# Patient Record
Sex: Male | Born: 1995 | Race: Black or African American | Hispanic: No | Marital: Single | Smoking: Never smoker
Health system: Southern US, Community
[De-identification: ages and names within clinical notes are randomized; demographics above are authoritative.]

## PROBLEM LIST (undated history)

## (undated) DIAGNOSIS — J45909 Unspecified asthma, uncomplicated: Secondary | ICD-10-CM

## (undated) HISTORY — DX: Unspecified asthma, uncomplicated: J45.909

## (undated) HISTORY — PX: EYE SURGERY: SHX253

---
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
## 2016-09-19 ENCOUNTER — Ambulatory Visit (HOSPITAL_COMMUNITY)
Admission: EM | Admit: 2016-09-19 | Discharge: 2016-09-19 | Disposition: A | Discharge status: Home / Self Care | Payer: 59 | Attending: Internal Medicine | Admitting: Internal Medicine

## 2016-09-19 ENCOUNTER — Encounter (HOSPITAL_COMMUNITY): Payer: Self-pay | Admitting: Emergency Medicine

## 2016-09-19 DIAGNOSIS — B2790 Infectious mononucleosis, unspecified without complication: Secondary | ICD-10-CM | POA: Diagnosis not present

## 2016-09-19 DIAGNOSIS — J029 Acute pharyngitis, unspecified: Secondary | ICD-10-CM | POA: Diagnosis present

## 2016-09-19 LAB — POCT RAPID STREP A: Streptococcus, Group A Screen (Direct): NEGATIVE

## 2016-09-19 LAB — POCT INFECTIOUS MONO SCREEN: MONO SCREEN: POSITIVE — AB

## 2016-09-19 MED ORDER — PREDNISONE 50 MG PO TABS: 50.0000 mg | ORAL_TABLET | Freq: Every day | ORAL | 0 refills | Status: DC

## 2016-09-19 NOTE — Discharge Instructions (Addendum)
Push fluids, rest.  Avoid contact injury (abdominal hit) for the next several weeks; spleen is often involved in mono and thought to be susceptible to injury.  Prescription for prednisone to help with throat pain/swelling.  Recheck as needed.  May take 4-6 weeks to feel more like yourself (energy level, well being).  Rest when you need to.

## 2016-09-19 NOTE — ED Provider Notes (Signed)
[facility]    CSN: [id number] Arrival date & time: 09/19/16  1128     History   Chief Complaint Chief Complaint  Patient presents with  . Sore Throat    HPI Mark Mueller is a 21 y.o. male. Patient presents today with one-week history of severe sore throat, fever, and malaise/fatigue. Not really coughing, no sinus congestion/drainage. Had a negative strep swab a week ago at the [facility] urgent care. Not really improving.    HPI  Past Medical History:  Diagnosis Date  . Asthma    Childhood    Patient Active Problem List   Diagnosis Date Noted  . Healthy adolescent on routine physical examination 02/20/2014  . Childhood asthma 02/20/2014    Past Surgical History:  Procedure Laterality Date  . EYE SURGERY         Home Medications    Prior to Admission medications   Medication Sig Start Date End Date Taking? Authorizing Provider  brompheniramine-pseudoephedrine-dextromethorphan (DIMETAPP DM) 15-1-5 MG/5ML ELIX Take by mouth every 6 (six) hours as needed.   Yes Historical Provider, MD  naproxen sodium (ANAPROX) 220 MG tablet Take 220 mg by mouth 2 (two) times daily with a meal.   Yes Historical Provider, MD  predniSONE (DELTASONE) 50 MG tablet Take 1 tablet (50 mg total) by mouth daily. 09/19/16   Eustace Moore, MD    Family History Family History  Problem Relation Age of Onset  . Thyroid disease Mother   . Rheum arthritis Father   . Hypertension Father   . Stroke Maternal Grandmother   . Diabetes Maternal Grandmother   . Heart disease Maternal Grandmother   . Hyperlipidemia Maternal Grandmother   . Hypertension Maternal Grandmother   . Hyperlipidemia Maternal Grandfather   . Hypertension Maternal Grandfather   . Hypertension Paternal Grandmother   . Stroke Paternal Grandmother   . Hypertension Paternal Grandfather     Social History Social History  Substance Use Topics  . Smoking status: Never Smoker  . Smokeless tobacco:  Not on file  . Alcohol use Not on file     Allergies   Patient has no known allergies.   Review of Systems Review of Systems  All other systems reviewed and are negative.    Physical Exam Triage Vital Signs ED Triage Vitals  Enc Vitals Group     BP 09/19/16 1211 132/77     Pulse Rate 09/19/16 1211 107     Resp 09/19/16 1211 16     Temp 09/19/16 1211 99.5 F (37.5 C)     Temp Source 09/19/16 1211 Oral     SpO2 09/19/16 1211 100 %     Weight --      Height --      Pain Score 09/19/16 1215 8   Updated Vital Signs BP 132/77 (BP Location: Left Arm)   Pulse 107   Temp 99.5 F (37.5 C) (Oral)   Resp 16   SpO2 100%  Physical Exam  Constitutional: He is oriented to person, place, and time. No distress.  Alert, nicely groomed  HENT:  Head: Atraumatic.  Moderately dull TMs bilaterally pink flushed but no erythema Moderate nasal congestion Very large red tonsils with necrotic-appearing exudates Voice quality is muffled  Eyes:  Conjugate gaze, no eye redness/drainage  Neck: Neck supple.  Cardiovascular: Normal rate.   Pulmonary/Chest: No respiratory distress.  Lungs clear, symmetric breath sounds  Abdominal: He exhibits no distension.  Musculoskeletal: Normal range of motion.  Neurological: He is alert and oriented to person, place, and time.  Skin: Skin is warm and dry.  No cyanosis  Nursing note and vitals reviewed.    UC Treatments / Results  Labs Results for orders placed or performed during the hospital encounter of 09/19/16  Culture, group A strep  Result Value Ref Range   Specimen Description THROAT    Special Requests NONE    Culture CULTURE REINCUBATED FOR BETTER GROWTH    Report Status PENDING   POCT rapid strep A [facility]  Result Value Ref Range   Streptococcus, Group A Screen (Direct) NEGATIVE NEGATIVE  Infectious mono screen, POC  Result Value Ref Range   Mono Screen POSITIVE (A) NEGATIVE     Procedures Procedures (including  critical care time) None today  Final Clinical Impressions(s) / UC Diagnoses   Final diagnoses:  Mononucleosis   Push fluids, rest.  Avoid contact injury (abdominal hit) for the next several weeks; spleen is often involved in mono and thought to be susceptible to injury.  Prescription for prednisone to help with throat pain/swelling.  Recheck as needed.  May take 4-6 weeks to feel more like yourself (energy level, well being).  Rest when you need to.  New Prescriptions Discharge Medication List as of 09/19/2016  1:05 PM    START taking these medications   Details  predniSONE (DELTASONE) 50 MG tablet Take 1 tablet (50 mg total) by mouth daily., Starting Mon 09/19/2016, Normal         Eustace MooreLaura W Sravya Grissom, MD 09/20/16 2042

## 2016-09-19 NOTE — ED Triage Notes (Signed)
[facility] a [facility] of a sore throat x 1 week. The patient stated that [facility] and prescribed a cough syrup but he stated that he has not gotten any better.

## 2016-09-21 LAB — CULTURE, GROUP A STREP (THRC)

## 2018-06-26 ENCOUNTER — Encounter (HOSPITAL_COMMUNITY): Payer: Self-pay | Admitting: Emergency Medicine

## 2018-06-26 ENCOUNTER — Ambulatory Visit (HOSPITAL_COMMUNITY)
Admission: EM | Admit: 2018-06-26 | Discharge: 2018-06-26 | Disposition: A | Discharge status: Home / Self Care | Payer: 59 | Attending: Family Medicine | Admitting: Family Medicine

## 2018-06-26 ENCOUNTER — Other Ambulatory Visit: Payer: Self-pay

## 2018-06-26 DIAGNOSIS — W06XXXA Fall from bed, initial encounter: Secondary | ICD-10-CM

## 2018-06-26 DIAGNOSIS — S01312A Laceration without foreign body of left ear, initial encounter: Secondary | ICD-10-CM | POA: Diagnosis not present

## 2018-06-26 DIAGNOSIS — S0181XA Laceration without foreign body of other part of head, initial encounter: Secondary | ICD-10-CM

## 2018-06-26 DIAGNOSIS — W268XXA Contact with other sharp object(s), not elsewhere classified, initial encounter: Secondary | ICD-10-CM | POA: Diagnosis not present

## 2018-06-26 NOTE — ED Triage Notes (Signed)
Reports falling out of bed around 5:30 am.  Landed on a lamp, broke lamp.  Laceration to left ear lobe, to area of face in front of left ear lobe.  Bleeding controlled.  Says he has cuts to back, but trainer has steri-stripped these areas

## 2018-06-27 NOTE — ED Provider Notes (Signed)
  [facility]   [id number] 06/26/18 Arrival Time: 1319  ASSESSMENT & PLAN:  1. Facial laceration, initial encounter   2. Ear lobe laceration, left, initial encounter     Procedure: Verbal consent obtained. Patient provided with risks and alternatives to the procedure. Wound copiously irrigated with NS then cleansed with betadine. Anesthetized wounds with 1% plain lidocaine. Wound carefully explored. No foreign body or nonviable tissue noted.  Wound #1 anterior to L ear: Closed with #2 6-0 Prolene interrupted sutures.  Wound #2 L earlobe: Closed with #3 6-0 Prolene interrupted sutures.  Patient tolerated procedure well. No complications. Minimal bleeding. Patient advised to look for and return for any signs of infection such as redness, swelling, discharge, or worsening pain. Return for suture removal in 7 days.  Reviewed expectations re: course of current medical issues. Questions answered. Outlined signs and symptoms indicating need for more acute intervention. Patient verbalized understanding. After Visit Summary given.   SUBJECTIVE:  Mark Mueller is a 22 y.o. male who presents with a lacerations of his left earlobe and anterior to his L ear this morning. Reports falling out of bed and breaking lamp. Cut these places in the process. Minimal bleeding that was easily controlled. Minimal pain. Few small cuts to his back. No significant pain. Washed at home.  Td UTD: Yes.  ROS: As per HPI.   OBJECTIVE:  Vitals:   06/26/18 1404  BP: 135/64  Pulse: 67  Resp: 18  Temp: 97.9 F (36.6 C)  TempSrc: Oral  SpO2: 99%     General appearance: alert; no distress Wound #1: 1cm anterior to L ear Wound #2: 1.5 cm L ear lobe No active bleeding. No pain to touch. Neck supple with FROM. Psychological: alert and cooperative; normal mood and affect   No Known Allergies  Past Medical History:  Diagnosis Date  . Asthma    Childhood   Social History    Socioeconomic History  . Marital status: Single    Spouse name: Not on file  . Number of children: Not on file  . Years of education: Not on file  . Highest education level: Not on file  Occupational History  . Not on file  Social Needs  . Financial resource strain: Not on file  . Food insecurity:    Worry: Not on file    Inability: Not on file  . Transportation needs:    Medical: Not on file    Non-medical: Not on file  Tobacco Use  . Smoking status: Never Smoker  Substance and Sexual Activity  . Alcohol use: Yes  . Drug use: Never  . Sexual activity: Not on file  Lifestyle  . Physical activity:    Days per week: Not on file    Minutes per session: Not on file  . Stress: Not on file  Relationships  . Social connections:    Talks on phone: Not on file    Gets together: Not on file    Attends religious service: Not on file    Active member of club or organization: Not on file    Attends meetings of clubs or organizations: Not on file    Relationship status: Not on file  Other Topics Concern  . Not on file  Social History Narrative  . Not on file         Mardella Layman, MD 06/27/18 [phone]

## 2018-08-02 ENCOUNTER — Ambulatory Visit (HOSPITAL_COMMUNITY)
Admission: EM | Admit: 2018-08-02 | Discharge: 2018-08-02 | Disposition: A | Discharge status: Home / Self Care | Payer: 59 | Attending: Family Medicine | Admitting: Family Medicine

## 2018-08-02 ENCOUNTER — Encounter (HOSPITAL_COMMUNITY): Payer: Self-pay | Admitting: Emergency Medicine

## 2018-08-02 ENCOUNTER — Other Ambulatory Visit: Payer: Self-pay

## 2018-08-02 DIAGNOSIS — J019 Acute sinusitis, unspecified: Secondary | ICD-10-CM

## 2018-08-02 MED ORDER — FLUTICASONE PROPIONATE 50 MCG/ACT NA SUSP: 1.0000 | Freq: Every day | NASAL | 2 refills | Status: DC

## 2018-08-02 MED ORDER — IBUPROFEN 800 MG PO TABS: 800.0000 mg | ORAL_TABLET | Freq: Three times a day (TID) | ORAL | 0 refills | Status: DC

## 2018-08-02 MED ORDER — CROMOLYN SODIUM 5.2 MG/ACT NA AERS: 1.0000 | INHALATION_SPRAY | Freq: Four times a day (QID) | NASAL | 12 refills | Status: DC | PRN

## 2018-08-02 MED ORDER — AMOXICILLIN-POT CLAVULANATE 875-125 MG PO TABS: 1.0000 | ORAL_TABLET | Freq: Two times a day (BID) | ORAL | 0 refills | Status: AC

## 2018-08-02 MED ORDER — GUAIFENESIN ER 600 MG PO TB12: 1200.0000 mg | ORAL_TABLET | Freq: Two times a day (BID) | ORAL | 0 refills | Status: AC | PRN

## 2018-08-02 NOTE — ED Provider Notes (Signed)
[facility]    CSN: [id number] Arrival date & time: 08/02/18  1516     History   Chief Complaint Chief Complaint  Patient presents with  . URI    HPI Mark Mueller is a 22 y.o. male.   Sanjuan presents with complaints of body aches, facial congestion, intermittent hot flashes/chills, nasal drainage. Started 11/10. Hasn't worsened but hasn't improved. No sore throat, no ear pain. Productive cough. No known ill contacts. Has taken OTC aspirin which has helped some. Has had slight diarrhea. No vomiting or abdominal pain. No rash. No other medications for symptoms. Without contributing medical history.      ROS per HPI.      Past Medical History:  Diagnosis Date  . Asthma    Childhood    Patient Active Problem List   Diagnosis Date Noted  . Healthy adolescent on routine physical examination 02/20/2014  . Childhood asthma 02/20/2014    Past Surgical History:  Procedure Laterality Date  . EYE SURGERY         Home Medications    Prior to Admission medications   Medication Sig Start Date End Date Taking? Authorizing Provider  NON FORMULARY    Yes [provider]  amoxicillin-clavulanate (AUGMENTIN) 875-125 MG tablet Take 1 tablet by mouth every 12 (twelve) hours for 10 days. 08/06/18 08/16/18  Georgetta Haber, NP  cromolyn (NASALCROM) 5.2 MG/ACT nasal spray Place 1 spray into both nostrils 4 (four) times daily as needed for rhinitis. 08/02/18   Georgetta Haber, NP  fluticasone (FLONASE) 50 MCG/ACT nasal spray Place 1 spray into both nostrils daily. 08/02/18   Georgetta Haber, NP  guaiFENesin (MUCINEX) 600 MG 12 hr tablet Take 2 tablets (1,200 mg total) by mouth 2 (two) times daily as needed for up to 5 days. 08/02/18 08/07/18  Georgetta Haber, NP  ibuprofen (ADVIL,MOTRIN) 800 MG tablet Take 1 tablet (800 mg total) by mouth 3 (three) times daily. 08/02/18   Georgetta Haber, NP    Family History Family History  Problem Relation Age  of Onset  . Thyroid disease Mother   . Rheum arthritis Father   . Hypertension Father   . Stroke Maternal Grandmother   . Diabetes Maternal Grandmother   . Heart disease Maternal Grandmother   . Hyperlipidemia Maternal Grandmother   . Hypertension Maternal Grandmother   . Hyperlipidemia Maternal Grandfather   . Hypertension Maternal Grandfather   . Hypertension Paternal Grandmother   . Stroke Paternal Grandmother   . Hypertension Paternal Grandfather     Social History Social History   Tobacco Use  . Smoking status: Never Smoker  Substance Use Topics  . Alcohol use: Yes  . Drug use: Never     Allergies   Patient has no known allergies.   Review of Systems Review of Systems   Physical Exam Triage Vital Signs ED Triage Vitals  Enc Vitals Group     BP 08/02/18 1625 (!) 141/64     Pulse Rate 08/02/18 1625 73     Resp 08/02/18 1625 18     Temp 08/02/18 1625 97.9 F (36.6 C)     Temp Source 08/02/18 1625 Oral     SpO2 08/02/18 1625 99 %     Weight --      Height --      Head Circumference --      Peak Flow --      Pain Score 08/02/18 1623 3  Pain Loc --      Pain Edu? --      Excl. in GC? --    No data found.  Updated Vital Signs BP (!) 141/64 (BP Location: Right Arm) Comment (BP Location): large cuff  Pulse 73   Temp 97.9 F (36.6 C) (Oral)   Resp 18   SpO2 99%    Physical Exam  Constitutional: He is oriented to person, place, and time. He appears well-developed and well-nourished.  HENT:  Head: Normocephalic and atraumatic.  Right Ear: Tympanic membrane, external ear and ear canal normal.  Left Ear: Tympanic membrane, external ear and ear canal normal.  Nose: Mucosal edema and rhinorrhea present. Right sinus exhibits no maxillary sinus tenderness and no frontal sinus tenderness. Left sinus exhibits no maxillary sinus tenderness and no frontal sinus tenderness.  Mouth/Throat: Uvula is midline, oropharynx is clear and moist and mucous membranes  are normal.  Eyes: Pupils are equal, round, and reactive to light. Conjunctivae are normal.  Neck: Normal range of motion.  Cardiovascular: Normal rate and regular rhythm.  Pulmonary/Chest: Effort normal and breath sounds normal.  Lymphadenopathy:    He has no cervical adenopathy.  Neurological: He is alert and oriented to person, place, and time.  Skin: Skin is warm and dry.  Vitals reviewed.    UC Treatments / Results  Labs (all labs ordered are listed, but only abnormal results are displayed) Labs Reviewed - No data to display  EKG None  Radiology No results found.  Procedures Procedures (including critical care time)  Medications Ordered in UC Medications - No data to display  Initial Impression / Assessment and Plan / UC Course  I have reviewed the triage vital signs and the nursing notes.  Pertinent labs & imaging results that were available during my care of the patient were reviewed by me and considered in my medical decision making (see chart for details).     Non toxic, afebrile. History and physical consistent with viral illness.  Supportive cares recommended.  Antibiotics sent for sinusitis, may start next week if worsening or no improvement. Return precautions provided. If symptoms worsen or do not improve in the next week to return to be seen or to follow up with PCP.  Patient verbalized understanding and agreeable to plan.   Final Clinical Impressions(s) / UC Diagnoses   Final diagnoses:  Acute sinusitis, recurrence not specified, unspecified location     Discharge Instructions     Push fluids to ensure adequate hydration and keep secretions thin.  Tylenol and/or ibuprofen as needed for pain or fevers.  Flonase daily and cromolyn nasal spray up to 4 times a day.  Mucinex as an expectorant.  If worsening or no improvement by Monday may start antibiotics. If any improvement no need to take, as this is likely viral in nature.    ED Prescriptions     Medication Sig Dispense Auth. Provider   fluticasone (FLONASE) 50 MCG/ACT nasal spray Place 1 spray into both nostrils daily. 16 g Linus MakoBurky, Kaye Luoma B, NP   cromolyn (NASALCROM) 5.2 MG/ACT nasal spray Place 1 spray into both nostrils 4 (four) times daily as needed for rhinitis. 26 mL Linus MakoBurky, Kiana Hollar B, NP   guaiFENesin (MUCINEX) 600 MG 12 hr tablet Take 2 tablets (1,200 mg total) by mouth 2 (two) times daily as needed for up to 5 days. 20 tablet Linus MakoBurky, Karey Suthers B, NP   ibuprofen (ADVIL,MOTRIN) 800 MG tablet Take 1 tablet (800 mg total) by mouth 3 (three)  times daily. 21 tablet Linus Mako B, NP   amoxicillin-clavulanate (AUGMENTIN) 875-125 MG tablet Take 1 tablet by mouth every 12 (twelve) hours for 10 days. 20 tablet Georgetta Haber, NP     Controlled Substance Prescriptions [place] Controlled Substance Registry consulted? Not Applicable   Georgetta Haber, NP 08/02/18 1715

## 2018-08-02 NOTE — Discharge Instructions (Signed)
Push fluids to ensure adequate hydration and keep secretions thin.  Tylenol and/or ibuprofen as needed for pain or fevers.  Flonase daily and cromolyn nasal spray up to 4 times a day.  Mucinex as an expectorant.  If worsening or no improvement by Monday may start antibiotics. If any improvement no need to take, as this is likely viral in nature.

## 2018-08-02 NOTE — ED Triage Notes (Signed)
Symptoms started 5 days ago.  Denies fever.  Reports chills and hot flashes, muscle aches.  Congestion in head.

## 2019-05-11 ENCOUNTER — Other Ambulatory Visit: Payer: Self-pay

## 2019-05-11 DIAGNOSIS — Z20822 Contact with and (suspected) exposure to covid-19: Secondary | ICD-10-CM

## 2019-05-12 LAB — NOVEL CORONAVIRUS, NAA: SARS-CoV-2, NAA: NOT DETECTED

## 2019-05-21 ENCOUNTER — Other Ambulatory Visit: Payer: Self-pay

## 2019-05-21 ENCOUNTER — Emergency Department (HOSPITAL_COMMUNITY): Payer: 59

## 2019-05-21 ENCOUNTER — Emergency Department (HOSPITAL_COMMUNITY)
Admission: EM | Admit: 2019-05-21 | Discharge: 2019-05-21 | Disposition: A | Discharge status: Home / Self Care | Payer: 59 | Attending: Emergency Medicine | Admitting: Emergency Medicine

## 2019-05-21 DIAGNOSIS — Z5321 Procedure and treatment not carried out due to patient leaving prior to being seen by health care provider: Secondary | ICD-10-CM | POA: Diagnosis not present

## 2019-05-21 DIAGNOSIS — R079 Chest pain, unspecified: Secondary | ICD-10-CM | POA: Insufficient documentation

## 2019-05-21 LAB — CBC
HCT: 41.3 % (ref 39.0–52.0)
Hemoglobin: 15.1 g/dL (ref 13.0–17.0)
MCH: 32.2 pg (ref 26.0–34.0)
MCHC: 36.6 g/dL — ABNORMAL HIGH (ref 30.0–36.0)
MCV: 88.1 fL (ref 80.0–100.0)
Platelets: 277 10*3/uL (ref 150–400)
RBC: 4.69 MIL/uL (ref 4.22–5.81)
RDW: 11.9 % (ref 11.5–15.5)
WBC: 5.2 10*3/uL (ref 4.0–10.5)
nRBC: 0 % (ref 0.0–0.2)

## 2019-05-21 LAB — BASIC METABOLIC PANEL
Anion gap: 12 (ref 5–15)
BUN: 12 mg/dL (ref 6–20)
CO2: 24 mmol/L (ref 22–32)
Calcium: 9.6 mg/dL (ref 8.9–10.3)
Chloride: 103 mmol/L (ref 98–111)
Creatinine, Ser: 1.31 mg/dL — ABNORMAL HIGH (ref 0.61–1.24)
GFR calc Af Amer: >60 mL/min (ref >60)
GFR calc non Af Amer: >60 mL/min (ref >60)
Glucose, Bld: 104 mg/dL — ABNORMAL HIGH (ref 70–99)
Potassium: 3.2 mmol/L — ABNORMAL LOW (ref 3.5–5.1)
Sodium: 139 mmol/L (ref 135–145)

## 2019-05-21 LAB — TROPONIN I (HIGH SENSITIVITY): Troponin I (High Sensitivity): 8 ng/L (ref <18)

## 2019-05-21 NOTE — ED Notes (Addendum)
PT stated he felt better and did not want to be seen

## 2019-05-21 NOTE — ED Triage Notes (Signed)
Patient c/o chest pain that lasted for just a little bit with some shortness of breath. States that he thinks its a panic attack because he was doing homework.

## 2020-04-28 ENCOUNTER — Ambulatory Visit: Payer: Self-pay | Attending: Internal Medicine

## 2020-04-28 DIAGNOSIS — Z23 Encounter for immunization: Secondary | ICD-10-CM

## 2020-04-28 NOTE — Progress Notes (Signed)
   Covid-19 Vaccination Clinic  Name:  Mark Mueller    MRN: [id number] DOB: [birth year]  04/28/2020  Mr. Mark Mueller was observed post Covid-19 immunization for 15 minutes without incident. He was provided with Vaccine Information Sheet and instruction to access the V-Safe system.   Mr. Mark Mueller was instructed to call 911 with any severe reactions post vaccine: Marland Kitchen Difficulty breathing  . Swelling of face and throat  . A fast heartbeat  . A bad rash all over body  . Dizziness and weakness   Immunizations Administered    Name Date Dose VIS Date Route   Pfizer COVID-19 Vaccine 04/28/2020  9:02 AM 0.3 mL 11/13/2018 Intramuscular   Manufacturer: [employer], [employer]   Lot: [id number]   NDC: [address]-5

## 2020-05-09 IMAGING — CR DG CHEST 2V
2 series · 2 of 2 positions shown · non-contrast
Comparison: None.

CLINICAL DATA: Chest pain, shortness of breath

EXAM:
CHEST - 2 VIEW

[chest pa]
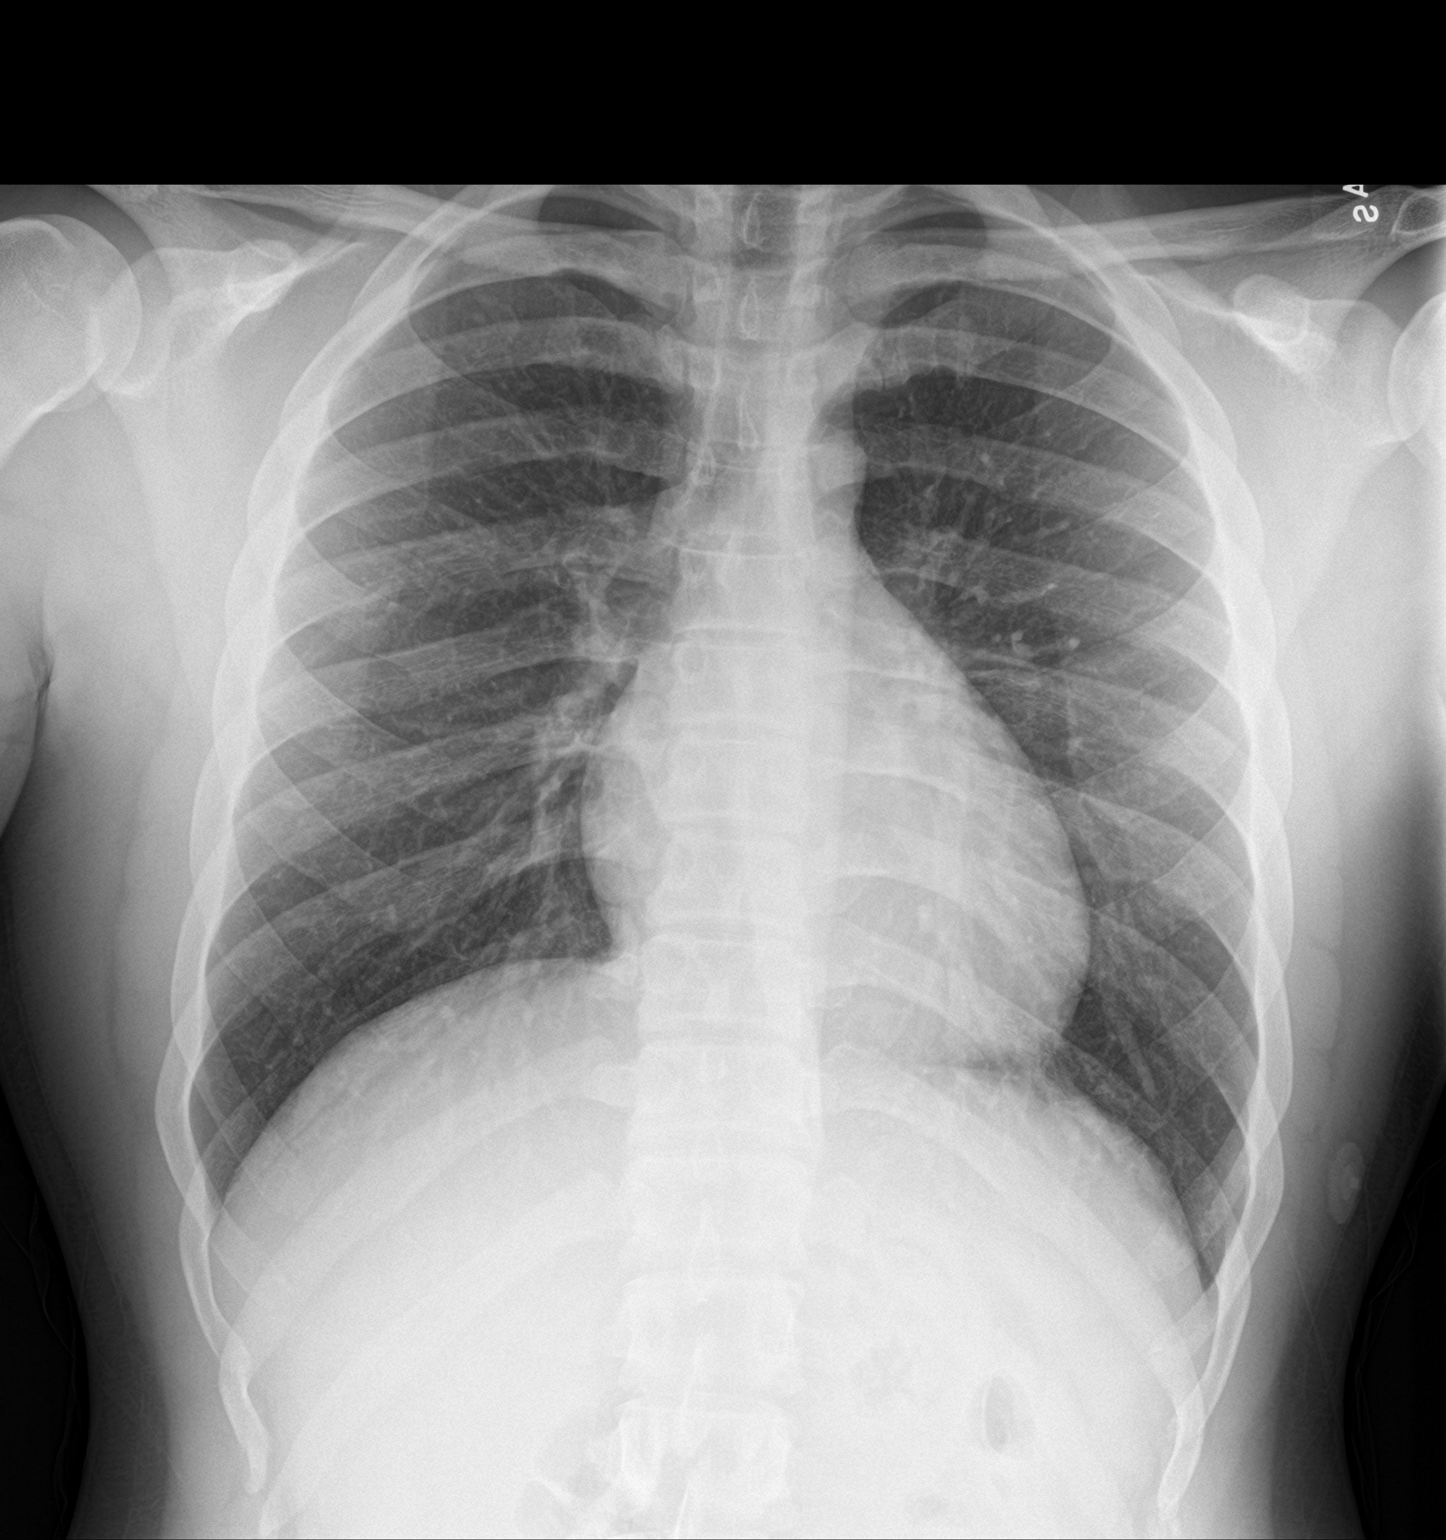

[chest lat]
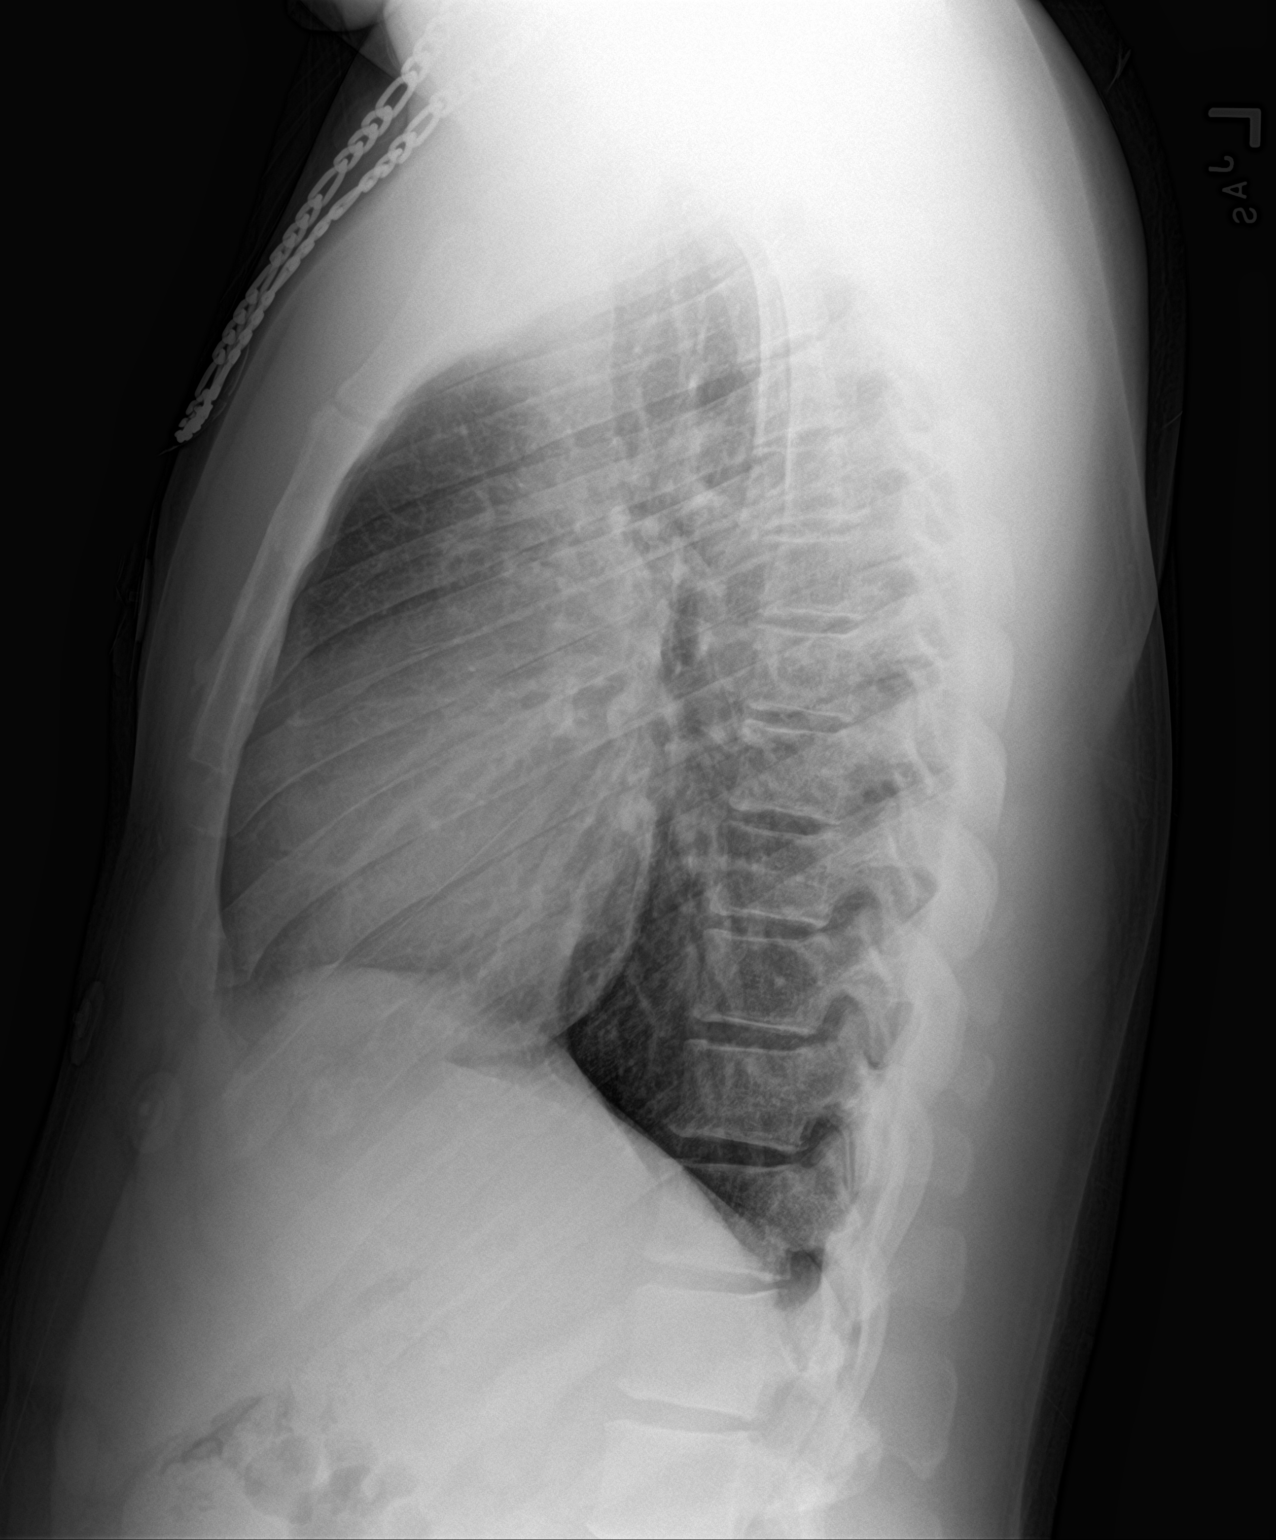

[2 of 2 positions shown; findings below may reference images not displayed]

FINDINGS: No consolidation, features of edema, pneumothorax, or effusion.
Pulmonary vascularity is normally distributed. The cardiomediastinal
contours are unremarkable. No acute osseous or soft tissue
abnormality.
IMPRESSION: No acute cardiopulmonary abnormality.

## 2020-05-19 ENCOUNTER — Ambulatory Visit: Payer: Self-pay | Attending: Critical Care Medicine

## 2020-05-19 DIAGNOSIS — Z23 Encounter for immunization: Secondary | ICD-10-CM

## 2020-05-19 NOTE — Progress Notes (Signed)
   Covid-19 Vaccination Clinic  Name:  Mark Mueller    MRN: [id number] DOB: Oct 14, 1995  05/19/2020  [name] was observed post Covid-19 immunization for 15 minutes without incident. He was provided with Vaccine Information Sheet and instruction to access the V-Safe system.   [name] was instructed to call 911 with any severe reactions post vaccine: Marland Kitchen Difficulty breathing  . Swelling of face and throat  . A fast heartbeat  . A bad rash all over body  . Dizziness and weakness   Immunizations Administered    Name Date Dose VIS Date Route   Pfizer COVID-19 Vaccine 05/19/2020  9:35 AM 0.3 mL 11/13/2018 Intramuscular   Manufacturer: [employer], [employer]   Lot: [id number]   NDC: [address]-0

## 2024-05-03 ENCOUNTER — Encounter (HOSPITAL_COMMUNITY): Payer: Self-pay

## 2024-05-03 ENCOUNTER — Ambulatory Visit (HOSPITAL_COMMUNITY)
Admission: EM | Admit: 2024-05-03 | Discharge: 2024-05-03 | Disposition: A | Discharge status: Home / Self Care | Attending: Emergency Medicine | Admitting: Emergency Medicine

## 2024-05-03 DIAGNOSIS — M542 Cervicalgia: Secondary | ICD-10-CM | POA: Diagnosis not present

## 2024-05-03 DIAGNOSIS — B349 Viral infection, unspecified: Secondary | ICD-10-CM

## 2024-05-03 DIAGNOSIS — R509 Fever, unspecified: Secondary | ICD-10-CM

## 2024-05-03 DIAGNOSIS — R519 Headache, unspecified: Secondary | ICD-10-CM

## 2024-05-03 MED ORDER — IBUPROFEN 800 MG PO TABS
800.0000 mg | ORAL_TABLET | Freq: Once | ORAL | Status: AC
  Administered 2024-05-03: 800 mg via ORAL

## 2024-05-03 MED ORDER — IBUPROFEN 800 MG PO TABS
ORAL_TABLET | ORAL | Status: AC
  Filled 2024-05-03: qty 1

## 2024-05-03 NOTE — Discharge Instructions (Addendum)
 As discussed I believe your symptoms are likely viral in nature. Be sure to alternate between 650 mg of Tylenol and 400 to 600 mg of ibuprofen  every 4-6 hours as needed for fever, headache, or bodyaches. Make sure you are staying well-hydrated getting plenty of rest. If you have worsening headache, worsening neck pain and stiffness, persistent high fevers unrelieved by medication, excessive vomiting, severe weakness, or passing out please seek immediate medical treatment in the emergency department. Otherwise follow-up with your primary care provider or return here as needed.

## 2024-05-03 NOTE — ED Triage Notes (Addendum)
 Chief Complaint: fever, chills, headache, and dizzy. Denies cough and congestion. Patient tested negative for COVID and flu today. Started having neck pain and headache today. States fever was 103 before leaving to come into the urgent care so last took OTC Dayquil before coming in.   Sick exposure: No  Onset: 2 days ago   Prescriptions or OTC medications tried: Yes- Day/Nyquil    with moderate relief  New foods, medications, or products: No  Recent Travel: No

## 2024-05-03 NOTE — ED Provider Notes (Addendum)
 [facility]    CSN: [id number] Arrival date & time: 05/03/24  1747      History   Chief Complaint Chief Complaint  Patient presents with   Fever    HPI Mark Mueller is a 28 y.o. male.   Patient presents with high fevers, chills, headache, and mild dizziness that began 2 days ago.  Patient states that he began to have some mild neck pain today.  [facility] earlier today and tested negative for COVID and flu.  Patient was concerned because he developed neck pain and continues to have a fever that he may have meningitis.  Patient states that his temperature was 103 prior to leaving home to come to the urgent care, and reports that he took some DayQuil prior to coming in.  Denies vomiting, diarrhea, abdominal pain, chest pain, shortness of breath, cough, and congestion.  The history is provided by the patient and medical records.  Fever   Past Medical History:  Diagnosis Date   Asthma    Childhood    Patient Active Problem List   Diagnosis Date Noted   Healthy adolescent on routine physical examination 02/20/2014   Childhood asthma 02/20/2014    Past Surgical History:  Procedure Laterality Date   EYE SURGERY         Home Medications    Prior to Admission medications   Not on File    Family History Family History  Problem Relation Age of Onset   Thyroid disease Mother    Rheum arthritis Father    Hypertension Father    Stroke Maternal Grandmother    Diabetes Maternal Grandmother    Heart disease Maternal Grandmother    Hyperlipidemia Maternal Grandmother    Hypertension Maternal Grandmother    Hyperlipidemia Maternal Grandfather    Hypertension Maternal Grandfather    Hypertension Paternal Grandmother    Stroke Paternal Grandmother    Hypertension Paternal Grandfather     Social History Social History   Tobacco Use   Smoking status: Never   Smokeless tobacco: Never  Vaping Use   Vaping status:  Never Used  Substance Use Topics   Alcohol use: Yes   Drug use: Never     Allergies   Patient has no known allergies.   Review of Systems Review of Systems  Constitutional:  Positive for fever.   Per HPI  Physical Exam Triage Vital Signs ED Triage Vitals  Encounter Vitals Group     BP 05/03/24 1829 121/82     Girls Systolic BP Percentile --      Girls Diastolic BP Percentile --      Boys Systolic BP Percentile --      Boys Diastolic BP Percentile --      Pulse Rate 05/03/24 1829 89     Resp 05/03/24 1829 18     Temp 05/03/24 1829 100.1 F (37.8 C)     Temp Source 05/03/24 1829 Oral     SpO2 05/03/24 1829 96 %     Weight 05/03/24 1829 224 lb (101.6 kg)     Height 05/03/24 1829 5' 8 (1.727 m)     Head Circumference --      Peak Flow --      Pain Score 05/03/24 1828 5     Pain Loc --      Pain Education --      Exclude from Growth Chart --    No data found.  Updated  Vital Signs BP 121/82 (BP Location: Left Arm)   Pulse 89   Temp 100.1 F (37.8 C) (Oral)   Resp 18   Ht 5' 8 (1.727 m)   Wt 224 lb (101.6 kg)   SpO2 96%   BMI 34.06 kg/m   Visual Acuity Right Eye Distance:   Left Eye Distance:   Bilateral Distance:    Right Eye Near:   Left Eye Near:    Bilateral Near:     Physical Exam Vitals and nursing note reviewed.  Constitutional:      General: He is awake. He is not in acute distress.    Appearance: Normal appearance. He is well-developed and well-groomed. He is not ill-appearing.  HENT:     Head: Normocephalic and atraumatic.     Right Ear: Tympanic membrane, ear canal and external ear normal.     Left Ear: Tympanic membrane, ear canal and external ear normal.     Nose: Congestion present.     Mouth/Throat:     Mouth: Mucous membranes are moist.     Pharynx: Oropharynx is clear. Posterior oropharyngeal erythema and postnasal drip present. No oropharyngeal exudate.  Eyes:     Extraocular Movements: Extraocular movements intact.      Pupils: Pupils are equal, round, and reactive to light.  Cardiovascular:     Rate and Rhythm: Normal rate and regular rhythm.  Pulmonary:     Effort: Pulmonary effort is normal.     Breath sounds: Normal breath sounds.  Musculoskeletal:     Cervical back: Normal range of motion and neck supple. No rigidity.  Lymphadenopathy:     Cervical: No cervical adenopathy.  Skin:    General: Skin is warm and dry.  Neurological:     General: No focal deficit present.     Mental Status: He is alert and oriented to person, place, and time. Mental status is at baseline.  Psychiatric:        Behavior: Behavior is cooperative.      UC Treatments / Results  Labs (all labs ordered are listed, but only abnormal results are displayed) Labs Reviewed - No data to display  EKG   Radiology No results found.  Procedures Procedures (including critical care time)  Medications Ordered in UC Medications  ibuprofen  (ADVIL ) tablet 800 mg (800 mg Oral Given 05/03/24 1921)    Initial Impression / Assessment and Plan / UC Course  I have reviewed the triage vital signs and the nursing notes.  Pertinent labs & imaging results that were available during my care of the patient were reviewed by me and considered in my medical decision making (see chart for details).     Patient is overall well-appearing.  Vitals are stable.  Temperature is slightly elevated at 100.1.  Mild congestion and mild erythema and PND noted to posterior oropharynx.  EOMI and PERRLA.  No neurodeficits noted.  Without nuchal rigidity present.  Without cervical adenopathy present.  Lungs clear bilaterally to auscultation.  Given ibuprofen  in clinic for acute pain.  Deferred retesting for COVID and flu due to testing negative for these earlier today.  Discussed that symptoms are likely viral in nature as there are no signs of acute bacterial infection on exam.  Recommended continuing with Tylenol and ibuprofen  as needed for fever,  headache, neck pain, and bodyaches.  Discussed follow-up, return, and strict ER precautions. Final Clinical Impressions(s) / UC Diagnoses   Final diagnoses:  Fever, unspecified fever cause  Intermittent headache  Neck  pain  Viral illness     Discharge Instructions      As discussed I believe your symptoms are likely viral in nature. Be sure to alternate between 650 mg of Tylenol and 400 to 600 mg of ibuprofen  every 4-6 hours as needed for fever, headache, or bodyaches. Make sure you are staying well-hydrated getting plenty of rest. If you have worsening headache, worsening neck pain and stiffness, persistent high fevers unrelieved by medication, excessive vomiting, severe weakness, or passing out please seek immediate medical treatment in the emergency department. Otherwise follow-up with your primary care provider or return here as needed.   ED Prescriptions   None    PDMP not reviewed this encounter.   Johnie Rumaldo LABOR, NP 05/03/24 1933    Johnie Rumaldo LABOR, NP 05/03/24 1950
